# Patient Record
Sex: Male | Born: 1990 | Race: White | Hispanic: No | Marital: Single | Smoking: Never smoker
Health system: Southern US, Community
[De-identification: ages and names within clinical notes are randomized; demographics above are authoritative.]

## PROBLEM LIST (undated history)

## (undated) DIAGNOSIS — M1990 Unspecified osteoarthritis, unspecified site: Secondary | ICD-10-CM

---

## 2016-11-01 ENCOUNTER — Encounter (HOSPITAL_COMMUNITY): Payer: Self-pay | Admitting: *Deleted

## 2016-11-01 ENCOUNTER — Emergency Department (HOSPITAL_COMMUNITY): Payer: BLUE CROSS/BLUE SHIELD

## 2016-11-01 ENCOUNTER — Emergency Department (HOSPITAL_COMMUNITY)
Admission: EM | Admit: 2016-11-01 | Discharge: 2016-11-01 | Disposition: A | Discharge status: Home / Self Care | Payer: BLUE CROSS/BLUE SHIELD | Attending: Physician Assistant | Admitting: Physician Assistant

## 2016-11-01 DIAGNOSIS — S61318A Laceration without foreign body of other finger with damage to nail, initial encounter: Secondary | ICD-10-CM | POA: Insufficient documentation

## 2016-11-01 DIAGNOSIS — S61102A Unspecified open wound of left thumb with damage to nail, initial encounter: Secondary | ICD-10-CM | POA: Diagnosis present

## 2016-11-01 DIAGNOSIS — W311XXA Contact with metalworking machines, initial encounter: Secondary | ICD-10-CM | POA: Diagnosis not present

## 2016-11-01 DIAGNOSIS — Y999 Unspecified external cause status: Secondary | ICD-10-CM | POA: Diagnosis not present

## 2016-11-01 DIAGNOSIS — Y9389 Activity, other specified: Secondary | ICD-10-CM | POA: Diagnosis not present

## 2016-11-01 DIAGNOSIS — S61319A Laceration without foreign body of unspecified finger with damage to nail, initial encounter: Secondary | ICD-10-CM

## 2016-11-01 DIAGNOSIS — Y9289 Other specified places as the place of occurrence of the external cause: Secondary | ICD-10-CM | POA: Diagnosis not present

## 2016-11-01 DIAGNOSIS — S61309A Unspecified open wound of unspecified finger with damage to nail, initial encounter: Secondary | ICD-10-CM

## 2016-11-01 HISTORY — DX: Unspecified osteoarthritis, unspecified site: M19.90

## 2016-11-01 MED ORDER — CEPHALEXIN 500 MG PO CAPS: 500.0000 mg | ORAL_CAPSULE | Freq: Four times a day (QID) | ORAL | 0 refills | Status: AC

## 2016-11-01 MED ORDER — OXYCODONE-ACETAMINOPHEN 5-325 MG PO TABS
1.0000 | ORAL_TABLET | Freq: Once | ORAL | Status: AC
  Administered 2016-11-01: 1 via ORAL

## 2016-11-01 MED ORDER — ONDANSETRON 4 MG PO TBDP
4.0000 mg | ORAL_TABLET | Freq: Once | ORAL | Status: AC
  Administered 2016-11-01: 4 mg via ORAL
  Filled 2016-11-01: qty 1

## 2016-11-01 MED ORDER — OXYCODONE-ACETAMINOPHEN 5-325 MG PO TABS
1.0000 | ORAL_TABLET | Freq: Once | ORAL | Status: AC
  Administered 2016-11-01: 1 via ORAL
  Filled 2016-11-01: qty 1

## 2016-11-01 MED ORDER — OXYCODONE-ACETAMINOPHEN 5-325 MG PO TABS: 1.0000 | ORAL_TABLET | Freq: Four times a day (QID) | ORAL | 0 refills | Status: AC | PRN

## 2016-11-01 MED ORDER — LIDOCAINE HCL (PF) 1 % IJ SOLN
20.0000 mL | Freq: Once | INTRAMUSCULAR | Status: AC
  Administered 2016-11-01: 20 mL via INTRADERMAL

## 2016-11-01 MED ORDER — OXYCODONE-ACETAMINOPHEN 5-325 MG PO TABS
ORAL_TABLET | ORAL | Status: AC
  Filled 2016-11-01: qty 1

## 2016-11-01 MED ORDER — LIDOCAINE HCL (PF) 1 % IJ SOLN
30.0000 mL | Freq: Once | INTRAMUSCULAR | Status: DC
  Filled 2016-11-01: qty 30

## 2016-11-01 MED ORDER — IBUPROFEN 800 MG PO TABS: 800.0000 mg | ORAL_TABLET | Freq: Three times a day (TID) | ORAL | 0 refills | Status: AC

## 2016-11-01 NOTE — ED Notes (Signed)
PA at the bedside performing Suture

## 2016-11-01 NOTE — ED Notes (Signed)
Pt. Returned to room from xray.

## 2016-11-01 NOTE — ED Provider Notes (Signed)
[facility] Note   CSN: [id number] Arrival date & time: 11/01/16  1024  By signing my name below, I, Thelma Barge, attest that this documentation has been prepared under the direction and in the presence of [employer]. Electronically Signed: Thelma Barge, Scribe. 11/01/16. 4:35 PM. History   Chief Complaint Chief Complaint  Patient presents with  . Hand Injury     The history is provided by the patient. No language interpreter was used.    HPI Comments: Jack Webster is a 26 y.o. male who presents to the Emergency Department complaining of a left thumb injury and resulting pain that occurred 3.5 hours ago while at work. His left thumb was caught between a piece of wood and metal, causing the nail and skin of fingertip to become ripped off. He went to urgent care and was then referred to the ED as they were unable to XRAY it there. He was given ibuprofen 800 mg and a Percocet with moderate relief of his pain. Pt denies weakness, numbness, or any other injuries at this time. He is left hand dominant.   Past Medical History:  Diagnosis Date  . Arthritis     There are no active problems to display for this patient.   History reviewed. No pertinent surgical history.     Home Medications    Prior to Admission medications   Medication Sig Start Date End Date Taking? Authorizing Provider  cephALEXin (KEFLEX) 500 MG capsule Take 1 capsule (500 mg total) by mouth 4 (four) times daily. 11/01/16   Emi Holes, PA-C  ibuprofen (ADVIL,MOTRIN) 800 MG tablet Take 1 tablet (800 mg total) by mouth 3 (three) times daily. 11/01/16   Emi Holes, PA-C  oxyCODONE-acetaminophen (PERCOCET/ROXICET) 5-325 MG tablet Take 1-2 tablets by mouth every 6 (six) hours as needed for severe pain. 11/01/16   Emi Holes, PA-C    Family History No family history on file.  Social History Social History  Substance Use Topics  . Smoking status: Never Smoker  . Smokeless  tobacco: Never Used  . Alcohol use No     Allergies   Patient has no known allergies.   Review of Systems Review of Systems  Skin: Positive for wound.  Neurological: Negative for weakness and numbness.     Physical Exam Updated Vital Signs BP 106/62 (BP Location: Right Arm)   Pulse 76   Temp 98.1 F (36.7 C) (Oral)   Resp 16   Ht  (1.753 m)   Wt 81.6 kg   SpO2 97%   BMI 26.58 kg/m   Physical Exam  Constitutional: He appears well-developed and well-nourished. No distress.  HENT:  Head: Normocephalic and atraumatic.  Mouth/Throat: Oropharynx is clear and moist. No oropharyngeal exudate.  Eyes: Conjunctivae are normal. Pupils are equal, round, and reactive to light. Right eye exhibits no discharge. Left eye exhibits no discharge. No scleral icterus.  Neck: Normal range of motion. Neck supple. No thyromegaly present.  Cardiovascular: Normal rate, regular rhythm, normal heart sounds and intact distal pulses.  Exam reveals no gallop and no friction rub.   No murmur heard. Pulmonary/Chest: Effort normal and breath sounds normal. No stridor. No respiratory distress. He has no wheezes. He has no rales.  Abdominal: Soft. Bowel sounds are normal. He exhibits no distension. There is no tenderness. There is no rebound and no guarding.  Musculoskeletal: Normal range of motion. He exhibits no edema.  Normal sensation and full ROM of left  thumb. Degloving with nail avulsion with 1 cm nail bed laceration. See photo.  Lymphadenopathy:    He has no cervical adenopathy.  Neurological: He is alert. Coordination normal.  Skin: Skin is warm and dry. No rash noted. He is not diaphoretic. No pallor.  Psychiatric: He has a normal mood and affect.  Nursing note and vitals reviewed.          ED Treatments / Results  DIAGNOSTIC STUDIES: Oxygen Saturation is 100% on RA, normal by my interpretation.    COORDINATION OF CARE: 12:05 AM Discussed treatment plan with pt at bedside and  pt agreed to plan.  Labs (all labs ordered are listed, but only abnormal results are displayed) Labs Reviewed - No data to display  EKG  EKG Interpretation None       Radiology Dg Hand Complete Left  Result Date: 11/01/2016 CLINICAL DATA:  Had a board fall onto left thumb today - crushed thumb in between and ripped off thumb nail and distal portion of skin - eval injury EXAM: LEFT HAND - COMPLETE 3+ VIEW COMPARISON:  None. FINDINGS: No fracture or dislocation.  No bone lesion. There is a soft tissue defect along the thumb tip. No radiopaque foreign body. IMPRESSION: 1. Soft tissue injury to the left thumb tip. No radiopaque foreign body. 2. No fracture.  No dislocation. Electronically Signed   By: Amie Portland M.D.   On: 11/01/2016 12:34    Procedures Procedures (including critical care time)  LACERATION REPAIR Performed by: Emi Holes Authorized by: Emi Holes Consent: Verbal consent obtained. Risks and benefits: risks, benefits and alternatives were discussed Consent given by: patient Patient identity confirmed: provided demographic data Prepped and Draped in normal sterile fashion Wound explored  Laceration Location: L thumb nail bed  Laceration Length: 1cm  No Foreign Bodies seen or palpated  Anesthesia: digital block  Local anesthetic: lidocaine 1% w/o epinephrine  Anesthetic total: 8 ml  Irrigation method: syringe Amount of cleaning: standard  Skin closure: 6-0 Vicryl rapide  Number of sutures: 3  Technique: simple interrupted  Patient tolerance: Patient tolerated the procedure well with no immediate complications.  I used xeroform dressing to keep nail bed patent and as nonstick dressing over laceration repair.    Medications Ordered in ED Medications  oxyCODONE-acetaminophen (PERCOCET/ROXICET) 5-325 MG per tablet 1 tablet (1 tablet Oral Given 11/01/16 1134)  ondansetron (ZOFRAN-ODT) disintegrating tablet 4 mg (4 mg Oral Given 11/01/16  1246)  lidocaine (PF) (XYLOCAINE) 1 % injection 20 mL (20 mLs Intradermal Given 11/01/16 1422)  ondansetron (ZOFRAN-ODT) disintegrating tablet 4 mg (4 mg Oral Given 11/01/16 1437)  oxyCODONE-acetaminophen (PERCOCET/ROXICET) 5-325 MG per tablet 1 tablet (1 tablet Oral Given 11/01/16 1540)     Initial Impression / Assessment and Plan / ED Course  I have reviewed the triage vital signs and the nursing notes.  Pertinent labs & imaging results that were available during my care of the patient were reviewed by me and considered in my medical decision making (see chart for details).     Patient with nail avulsion with nail bed laceration and lost of epidermis. X ray is negative for fracture, FB, or dislocation. I spoke with Dr. Janee Morn who advised nail bed repair, dressing, and follow up to his office for wound check. Tetanus updated at urgent care PTA. Initiated Keflex and Ibuprofen and Percocet for pain control. [place] narcotic database reviewed without discrepancies. Return precautions discussed. Patient understands and agrees with plan. Patient vitals stable and discharged  in satisfactory condition.  Final Clinical Impressions(s) / ED Diagnoses   Final diagnoses:  Avulsion of fingernail, initial encounter  Laceration of nail bed of finger, initial encounter    New Prescriptions Discharge Medication List as of 11/01/2016  3:44 PM    START taking these medications   Details  cephALEXin (KEFLEX) 500 MG capsule Take 1 capsule (500 mg total) by mouth 4 (four) times daily., Starting Fri 11/01/2016, Print    ibuprofen (ADVIL,MOTRIN) 800 MG tablet Take 1 tablet (800 mg total) by mouth 3 (three) times daily., Starting Fri 11/01/2016, Print    oxyCODONE-acetaminophen (PERCOCET/ROXICET) 5-325 MG tablet Take 1-2 tablets by mouth every 6 (six) hours as needed for severe pain., Starting Fri 11/01/2016, Print       I personally performed the services described in this documentation, which was scribed in my  presence. The recorded information has been reviewed and is accurate.      Emi Holes, PA-C 11/02/16 1638    Courteney Randall An, MD 11/07/16 [id number]

## 2016-11-01 NOTE — ED Triage Notes (Signed)
Pt in from home pt reports catching L thumb between wood & metal while working on a truck, pt seen at [facility] & sent here for eval, +radial pulse, pt has missing skin on L thumb, nail not present, A&O x4, pt rcvd tetanus pta

## 2016-11-01 NOTE — ED Notes (Signed)
PA at the bedside.

## 2016-11-01 NOTE — ED Notes (Signed)
Pt brought back from Xray  

## 2016-11-01 NOTE — ED Notes (Signed)
Pt taken to Xray.

## 2016-11-01 NOTE — Discharge Instructions (Signed)
Keep dressing applied until you are seen by Dr. Janee Morn.

## 2017-09-01 IMAGING — DX DG HAND COMPLETE 3+V*L*
3 series · 3 of 3 positions shown · non-contrast
Comparison: None.

CLINICAL DATA: Had a board fall onto left thumb today - crushed
thumb in between and ripped off thumb nail and distal portion of
skin - eval injury

EXAM:
LEFT HAND - COMPLETE 3+ VIEW

[hand pa]
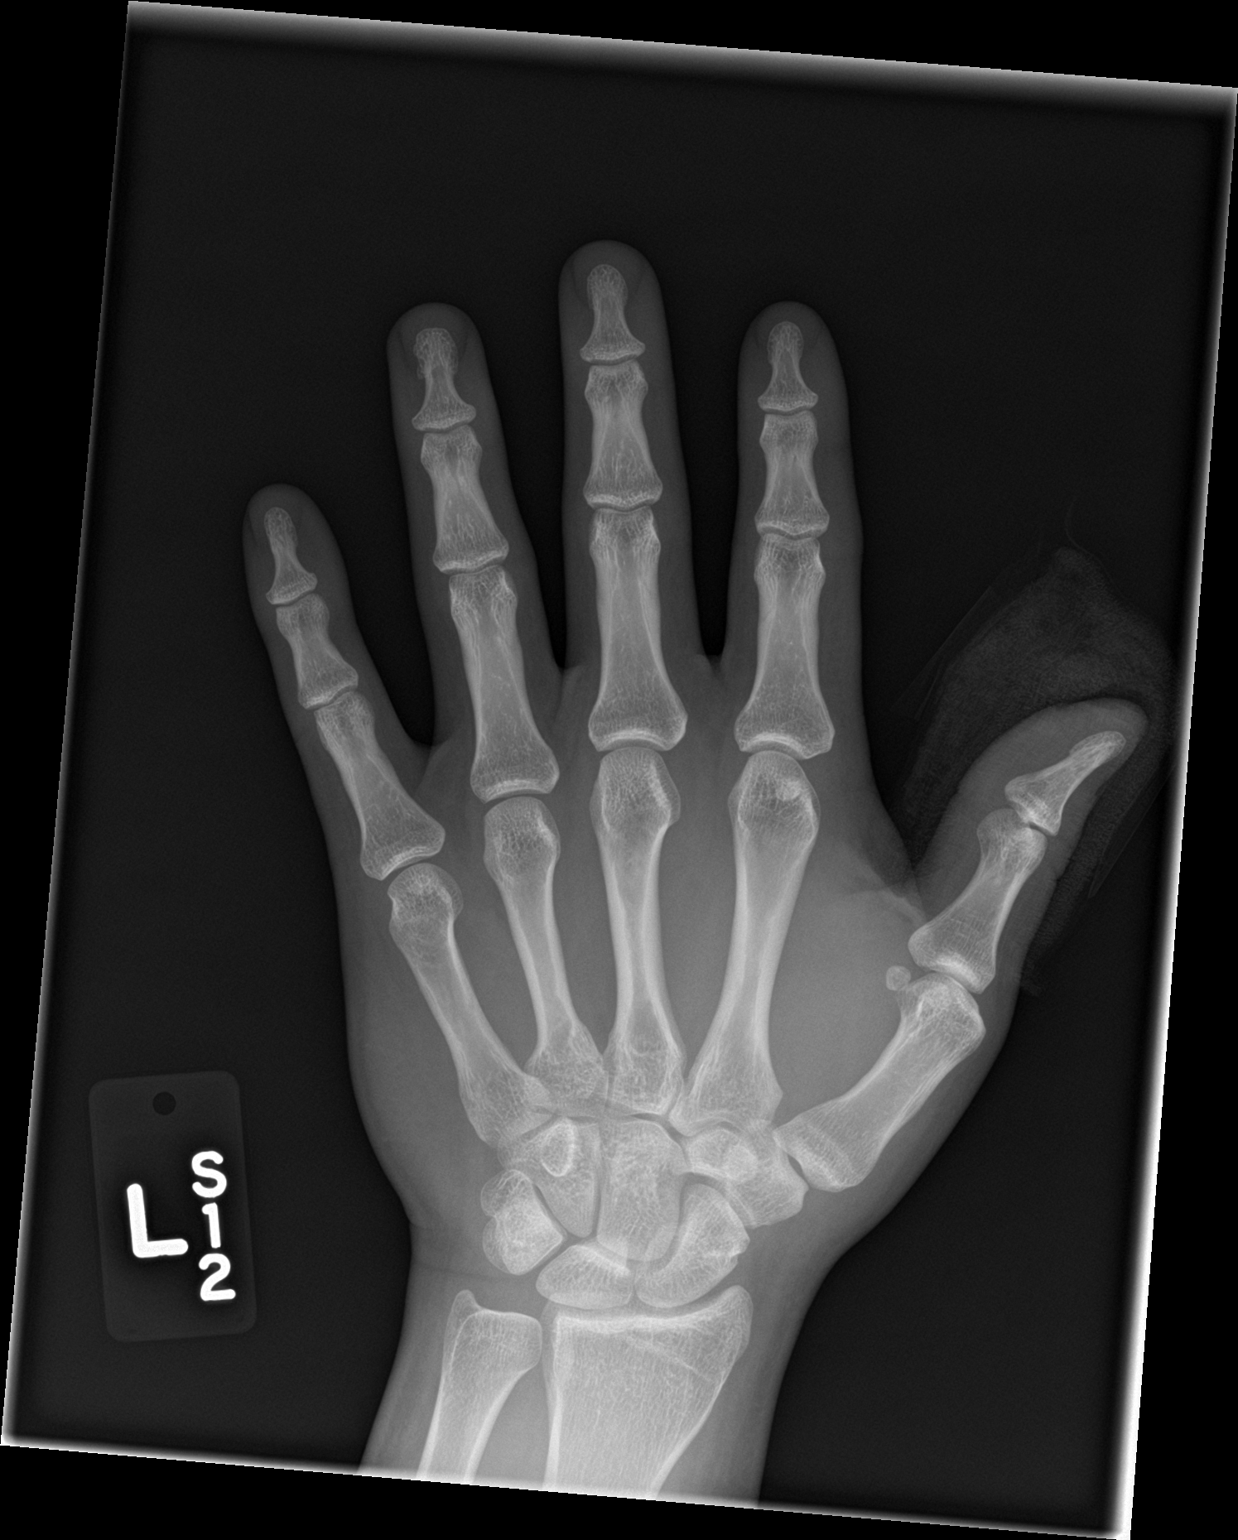

[hand obl]
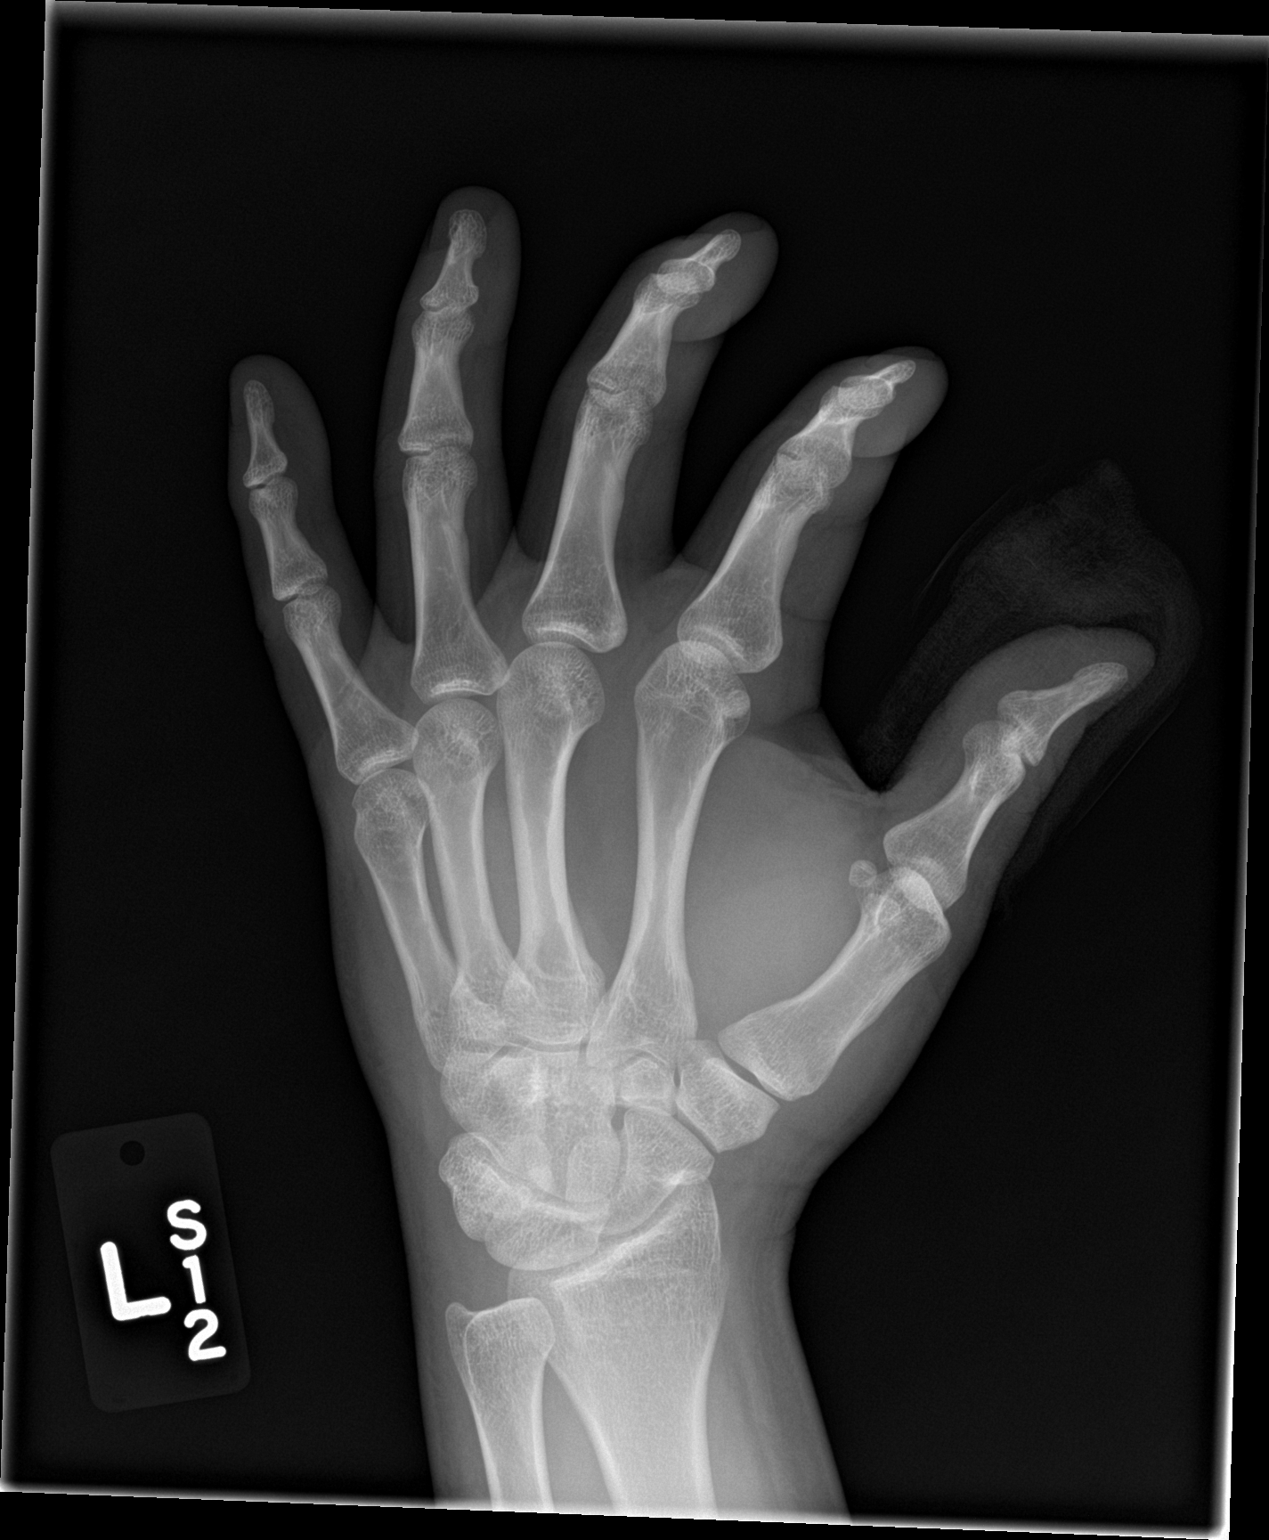

[hand lat]
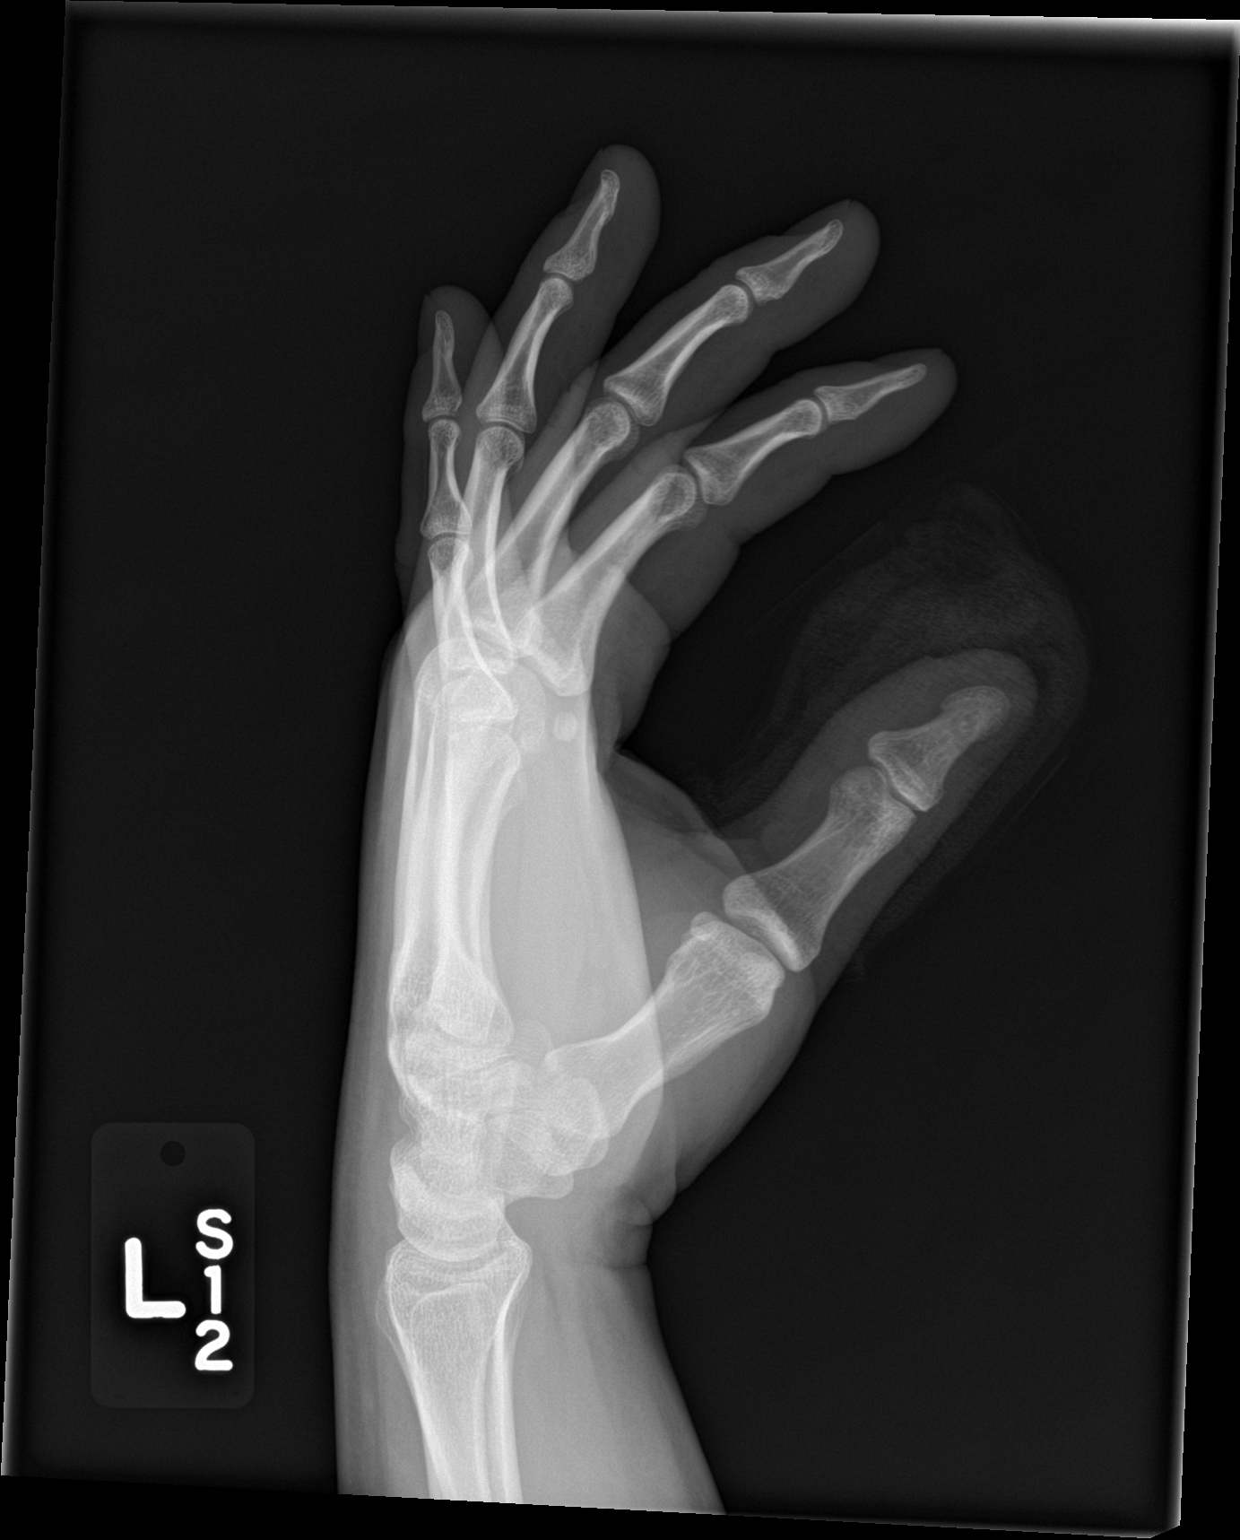

[3 of 3 positions shown; findings below may reference images not displayed]

FINDINGS: No fracture or dislocation.  No bone lesion.

There is a soft tissue defect along the thumb tip. No radiopaque
foreign body.
IMPRESSION: 1. Soft tissue injury to the left thumb tip. No radiopaque foreign
body.
2. No fracture.  No dislocation.
